# Patient Record
Sex: Male | Born: 1945 | Race: White | Hispanic: No | Smoking: Former smoker
Health system: Southern US, Community
[De-identification: ages and names within clinical notes are randomized; demographics above are authoritative.]

## PROBLEM LIST (undated history)

## (undated) DIAGNOSIS — I10 Essential (primary) hypertension: Secondary | ICD-10-CM

## (undated) HISTORY — PX: FEMUR FRACTURE SURGERY: SHX633

---

## 2017-04-29 ENCOUNTER — Other Ambulatory Visit: Payer: Self-pay

## 2017-04-29 ENCOUNTER — Emergency Department
Admission: EM | Admit: 2017-04-29 | Discharge: 2017-04-29 | Disposition: A | Discharge status: Home / Self Care | Payer: Medicare Other | Attending: Emergency Medicine | Admitting: Emergency Medicine

## 2017-04-29 ENCOUNTER — Encounter: Payer: Self-pay | Admitting: Emergency Medicine

## 2017-04-29 ENCOUNTER — Emergency Department: Payer: Medicare Other

## 2017-04-29 DIAGNOSIS — J40 Bronchitis, not specified as acute or chronic: Secondary | ICD-10-CM | POA: Insufficient documentation

## 2017-04-29 DIAGNOSIS — Z87891 Personal history of nicotine dependence: Secondary | ICD-10-CM | POA: Insufficient documentation

## 2017-04-29 DIAGNOSIS — R05 Cough: Secondary | ICD-10-CM

## 2017-04-29 DIAGNOSIS — I5020 Unspecified systolic (congestive) heart failure: Secondary | ICD-10-CM | POA: Diagnosis not present

## 2017-04-29 DIAGNOSIS — R059 Cough, unspecified: Secondary | ICD-10-CM

## 2017-04-29 LAB — BRAIN NATRIURETIC PEPTIDE: B Natriuretic Peptide: 1536 pg/mL — ABNORMAL HIGH (ref 0.0–100.0)

## 2017-04-29 LAB — BASIC METABOLIC PANEL
Anion gap: 9 (ref 5–15)
BUN: 15 mg/dL (ref 6–20)
CHLORIDE: 108 mmol/L (ref 101–111)
CO2: 23 mmol/L (ref 22–32)
CREATININE: 1.04 mg/dL (ref 0.61–1.24)
Calcium: 9.1 mg/dL (ref 8.9–10.3)
GFR calc Af Amer: >60 mL/min (ref >60)
GFR calc non Af Amer: >60 mL/min (ref >60)
GLUCOSE: 87 mg/dL (ref 65–99)
Potassium: 4.7 mmol/L (ref 3.5–5.1)
SODIUM: 140 mmol/L (ref 135–145)

## 2017-04-29 LAB — CBC
HCT: 40.7 % (ref 40.0–52.0)
Hemoglobin: 13.2 g/dL (ref 13.0–18.0)
MCH: 29.5 pg (ref 26.0–34.0)
MCHC: 32.4 g/dL (ref 32.0–36.0)
MCV: 90.8 fL (ref 80.0–100.0)
PLATELETS: 215 10*3/uL (ref 150–440)
RBC: 4.48 MIL/uL (ref 4.40–5.90)
RDW: 15 % — AB (ref 11.5–14.5)
WBC: 5.8 10*3/uL (ref 3.8–10.6)

## 2017-04-29 LAB — TROPONIN I: Troponin I: <0.03 ng/mL (ref <0.03)

## 2017-04-29 MED ORDER — IPRATROPIUM-ALBUTEROL 0.5-2.5 (3) MG/3ML IN SOLN
3.0000 mL | Freq: Once | RESPIRATORY_TRACT | Status: AC
  Administered 2017-04-29: 3 mL via RESPIRATORY_TRACT
  Filled 2017-04-29 (×2): qty 3

## 2017-04-29 MED ORDER — AZITHROMYCIN 250 MG PO TABS: ORAL_TABLET | ORAL | 0 refills | Status: AC

## 2017-04-29 MED ORDER — METHYLPREDNISOLONE SODIUM SUCC 125 MG IJ SOLR
125.0000 mg | Freq: Once | INTRAMUSCULAR | Status: AC
  Administered 2017-04-29: 125 mg via INTRAVENOUS
  Filled 2017-04-29: qty 2

## 2017-04-29 MED ORDER — IPRATROPIUM-ALBUTEROL 0.5-2.5 (3) MG/3ML IN SOLN
3.0000 mL | Freq: Once | RESPIRATORY_TRACT | Status: AC
  Administered 2017-04-29: 3 mL via RESPIRATORY_TRACT
  Filled 2017-04-29: qty 3

## 2017-04-29 MED ORDER — AZITHROMYCIN 500 MG PO TABS
500.0000 mg | ORAL_TABLET | Freq: Once | ORAL | Status: AC
  Administered 2017-04-29: 500 mg via ORAL
  Filled 2017-04-29: qty 1

## 2017-04-29 MED ORDER — ALBUTEROL SULFATE HFA 108 (90 BASE) MCG/ACT IN AERS: 2.0000 | INHALATION_SPRAY | Freq: Four times a day (QID) | RESPIRATORY_TRACT | 2 refills | Status: DC | PRN

## 2017-04-29 MED ORDER — FUROSEMIDE 20 MG PO TABS: ORAL_TABLET | ORAL | 11 refills | Status: DC

## 2017-04-29 MED ORDER — FUROSEMIDE 10 MG/ML IJ SOLN
20.0000 mg | Freq: Once | INTRAMUSCULAR | Status: AC
  Administered 2017-04-29: 20 mg via INTRAVENOUS
  Filled 2017-04-29: qty 4

## 2017-04-29 NOTE — ED Notes (Signed)
Ambulated pt with O2 starting at 95%. Pt states she feels ok at first and then starts to feel a little winded with O2 dropping to 88-89%. Pt back in bed and hooked to monitor.

## 2017-04-29 NOTE — ED Notes (Addendum)
Called pharmacy and spoke with Barbara Cower to send up duoneb since we are out in pyxis

## 2017-04-29 NOTE — ED Provider Notes (Signed)
[facility] Emergency Department Provider Note   ____________________________________________   First MD Initiated Contact with Patient 04/29/17 1612     (approximate)  I have reviewed the triage vital signs and the nursing notes.   HISTORY  Chief Complaint Cough    HPI Gerald Gross is a 72 y.o. male Patient has had a cough productive of some yellow-green phlegm for the last 2 weeks. Some chest pain when he coughs otherwise not. He does get winded if he walks a fair distance. Acute short of breath and he walks. In the emergency room patient received a DuoNeb and felt better on for a walk and desatted down to 88.   History reviewed. No pertinent past medical history.  There are no active problems to display for this patient.   History reviewed. No pertinent surgical history.  Prior to Admission medications   Not on File    Allergies Patient has no known allergies.  History reviewed. No pertinent family history.  Social History Social History   Tobacco Use  . Smoking status: Former Smoker    Types: Cigarettes    Last attempt to quit: 04/08/1990    Years since quitting: 27.0  . Smokeless tobacco: Never Used  Substance Use Topics  . Alcohol use: No    Frequency: Never  . Drug use: No    Review of Systems  Constitutional: No fever/chills Eyes: No visual changes. ENT: No sore throat. Cardiovascular: Denies chest pain. Respiratory: shortness of breath. Gastrointestinal: No abdominal pain.  No nausea, no vomiting.  No diarrhea.  No constipation. Genitourinary: Negative for dysuria. Musculoskeletal: Negative for back pain. Skin: Negative for rash. Neurological: Negative for headaches, focal weakness  ____________________________________________   PHYSICAL EXAM:  VITAL SIGNS: ED Triage Vitals  Enc Vitals Group     BP 04/29/17 1407 (!) 155/98     Pulse Rate 04/29/17 1407 96     Resp 04/29/17 1407 18     Temp 04/29/17 1407 98 F  (36.7 C)     Temp Source 04/29/17 1407 Oral     SpO2 04/29/17 1407 97 %     Weight 04/29/17 1408 210 lb (95.3 kg)     Height 04/29/17 1408 5\' 10"  (1.778 m)     Head Circumference --      Peak Flow --      Pain Score 04/29/17 1418 3     Pain Loc --      Pain Edu? --      Excl. in GC? --     Constitutional: Alert and oriented. Well appearing and in no acute distress. Eyes: Conjunctivae are normal.  Head: Atraumatic. Nose: No congestion/rhinnorhea. Mouth/Throat: Mucous membranes are moist.  Oropharynx non-erythematous. Neck: No stridor.   Cardiovascular: Normal rate, regular rhythm. Grossly normal heart sounds.  Good peripheral circulation. Respiratory: Normal respiratory effort.  No retractions. Lungs scattered wheezes Gastrointestinal: Soft and nontender. No distention. No abdominal bruits. No CVA tenderness. Musculoskeletal: No lower extremity tenderness nor edema.  No joint effusions. Neurologic:  Normal speech and language. No gross focal neurologic deficits are appreciated. No gait instability. Skin:  Skin is warm, dry and intact. No rash noted. Psychiatric: Mood and affect are normal. Speech and behavior are normal.  ____________________________________________   LABS (all labs ordered are listed, but only abnormal results are displayed)  Labs Reviewed  CBC - Abnormal; Notable for the following components:      Result Value   RDW 15.0 (*)    All  other components within normal limits  BRAIN NATRIURETIC PEPTIDE - Abnormal; Notable for the following components:   B Natriuretic Peptide 1,536.0 (*)    All other components within normal limits  BASIC METABOLIC PANEL  TROPONIN I   ____________________________________________  EKG  KG read and interpreted by me shows normal sinus rhythm rate of 100left axis flipped T's laterally no old EKGs are present there is one PVC ____________________________________________  RADIOLOGY  Dg Chest 2 View  Result Date:  04/29/2017 CLINICAL DATA:  Dyspnea for a week with chest pain for several days. Former smoker. Patient has had a cold for a few weeks. EXAM: CHEST  2 VIEW COMPARISON:  None. FINDINGS: The heart size and mediastinal contours are within normal limits. Diffuse coarsened interstitial lung markings are suspicious for chronic bronchitic change. No pneumonic consolidation. No effusion or pneumothorax. The visualized skeletal structures are unremarkable. IMPRESSION: Diffuse coarsened interstitial lung markings are suspicious for chronic bronchitic change. Electronically Signed   By: Tollie Eth M.D.   On: 04/29/2017 14:44  chest x-ray read as chronic bronchitis. Patient's coughing up green phlegm would be consistent with this however his BNP is also greater than 1000.  ____________________________________________   PROCEDURES  Procedure(s) performed:   Procedures  Critical Care performed:   ____________________________________________   INITIAL IMPRESSION / ASSESSMENT AND PLAN / ED COURSE       Clinical Course as of Apr 29 2157  Tue Apr 29, 2017  1612 HCT: 40.7 [PM]    Clinical Course User Index [PM] Arnaldo Natal, MD     ____________________________________________   FINAL CLINICAL IMPRESSION(S) / ED DIAGNOSES  after the Lasix patient reports he feels a lot better. I will give him Zithromax for a possible bronchitis and Lasix 20 to take every other day and an inhaler and I will have him follow-up with a doctor this week. will also give him 20 of Lasix to use every other day.     Final diagnoses:  Cough  Bronchitis  Systolic congestive heart failure, unspecified HF chronicity [facility])     ED Discharge Orders    None       Note:  This document was prepared using Dragon voice recognition software and may include unintentional dictation errors.    Arnaldo Natal, MD 04/29/17 2159

## 2017-04-29 NOTE — ED Notes (Signed)
Pt states he urinated about the couple times he went to toilet.

## 2017-04-29 NOTE — Discharge Instructions (Addendum)
use the inhaler 2 puffs 4 times a day as needed for cough or shortness of breath. Take the Zithromax as directed.take the Lasix one every other day. Please follow-up with a doctor this week. you can try [facility] walk-in or [facility] or [facility] or [facility] or the [facility] or [facility] clinic.please return here if he get worse at all.

## 2017-04-29 NOTE — ED Triage Notes (Signed)
Pt presents with cough x 3 weeks. Pt states he has been coughing up green phlegm x 2 weeks. Pt states chest only hurts when he is coughing. Denies respiratory distress or uri symptoms. Pt alert & oriented with NAD noted.

## 2017-05-14 ENCOUNTER — Other Ambulatory Visit (HOSPITAL_COMMUNITY): Payer: Self-pay | Admitting: Internal Medicine

## 2017-05-14 DIAGNOSIS — J849 Interstitial pulmonary disease, unspecified: Secondary | ICD-10-CM

## 2017-05-22 ENCOUNTER — Ambulatory Visit
Admission: RE | Admit: 2017-05-22 | Discharge: 2017-05-22 | Disposition: A | Discharge status: Home / Self Care | Payer: Medicare Other | Source: Ambulatory Visit | Attending: Internal Medicine | Admitting: Internal Medicine

## 2017-05-22 DIAGNOSIS — J849 Interstitial pulmonary disease, unspecified: Secondary | ICD-10-CM | POA: Diagnosis present

## 2017-05-22 DIAGNOSIS — J432 Centrilobular emphysema: Secondary | ICD-10-CM | POA: Insufficient documentation

## 2017-05-22 DIAGNOSIS — R918 Other nonspecific abnormal finding of lung field: Secondary | ICD-10-CM | POA: Insufficient documentation

## 2017-05-22 DIAGNOSIS — I700 Atherosclerosis of aorta: Secondary | ICD-10-CM | POA: Insufficient documentation

## 2017-05-22 DIAGNOSIS — J438 Other emphysema: Secondary | ICD-10-CM | POA: Insufficient documentation

## 2017-06-09 ENCOUNTER — Other Ambulatory Visit: Payer: Self-pay | Admitting: Cardiology

## 2017-06-10 ENCOUNTER — Encounter
Admission: RE | Disposition: A | Discharge status: Home / Self Care | Payer: Self-pay | Source: Ambulatory Visit | Attending: Cardiology

## 2017-06-10 ENCOUNTER — Ambulatory Visit
Admission: RE | Admit: 2017-06-10 | Discharge: 2017-06-10 | Disposition: A | Discharge status: Home / Self Care | Payer: Medicare Other | Source: Ambulatory Visit | Attending: Cardiology | Admitting: Cardiology

## 2017-06-10 DIAGNOSIS — E7849 Other hyperlipidemia: Secondary | ICD-10-CM | POA: Diagnosis not present

## 2017-06-10 DIAGNOSIS — I428 Other cardiomyopathies: Secondary | ICD-10-CM | POA: Insufficient documentation

## 2017-06-10 DIAGNOSIS — Z87891 Personal history of nicotine dependence: Secondary | ICD-10-CM | POA: Insufficient documentation

## 2017-06-10 DIAGNOSIS — Z8249 Family history of ischemic heart disease and other diseases of the circulatory system: Secondary | ICD-10-CM | POA: Diagnosis not present

## 2017-06-10 DIAGNOSIS — I429 Cardiomyopathy, unspecified: Secondary | ICD-10-CM

## 2017-06-10 DIAGNOSIS — I700 Atherosclerosis of aorta: Secondary | ICD-10-CM | POA: Diagnosis not present

## 2017-06-10 DIAGNOSIS — I5022 Chronic systolic (congestive) heart failure: Secondary | ICD-10-CM | POA: Diagnosis not present

## 2017-06-10 HISTORY — DX: Essential (primary) hypertension: I10

## 2017-06-10 HISTORY — PX: LEFT HEART CATH AND CORONARY ANGIOGRAPHY: CATH118249

## 2017-06-10 SURGERY — LEFT HEART CATH AND CORONARY ANGIOGRAPHY
Anesthesia: Moderate Sedation | Laterality: Left

## 2017-06-10 MED ORDER — ASPIRIN 81 MG PO CHEW
81.0000 mg | CHEWABLE_TABLET | ORAL | Status: AC
  Administered 2017-06-10: 81 mg via ORAL

## 2017-06-10 MED ORDER — IOPAMIDOL (ISOVUE-300) INJECTION 61%
INTRAVENOUS | Status: DC | PRN
  Administered 2017-06-10: 75 mL via INTRA_ARTERIAL

## 2017-06-10 MED ORDER — ASPIRIN 81 MG PO CHEW
CHEWABLE_TABLET | ORAL | Status: AC
  Filled 2017-06-10: qty 1

## 2017-06-10 MED ORDER — SODIUM CHLORIDE 0.9% FLUSH: 3.0000 mL | Freq: Two times a day (BID) | INTRAVENOUS | Status: DC

## 2017-06-10 MED ORDER — SODIUM CHLORIDE 0.9% FLUSH: 3.0000 mL | INTRAVENOUS | Status: DC | PRN

## 2017-06-10 MED ORDER — SODIUM CHLORIDE 0.9 % WEIGHT BASED INFUSION
3.0000 mL/kg/h | INTRAVENOUS | Status: AC
  Administered 2017-06-10: 3 mL/kg/h via INTRAVENOUS

## 2017-06-10 MED ORDER — FENTANYL CITRATE (PF) 100 MCG/2ML IJ SOLN
INTRAMUSCULAR | Status: AC
  Filled 2017-06-10: qty 2

## 2017-06-10 MED ORDER — SODIUM CHLORIDE 0.9 % IV SOLN: 250.0000 mL | INTRAVENOUS | Status: DC | PRN

## 2017-06-10 MED ORDER — MIDAZOLAM HCL 2 MG/2ML IJ SOLN
INTRAMUSCULAR | Status: AC
  Filled 2017-06-10: qty 2

## 2017-06-10 MED ORDER — SODIUM CHLORIDE 0.9 % WEIGHT BASED INFUSION: 1.0000 mL/kg/h | INTRAVENOUS | Status: DC

## 2017-06-10 MED ORDER — FENTANYL CITRATE (PF) 100 MCG/2ML IJ SOLN
INTRAMUSCULAR | Status: DC | PRN
  Administered 2017-06-10: 25 ug via INTRAVENOUS

## 2017-06-10 MED ORDER — MIDAZOLAM HCL 2 MG/2ML IJ SOLN
INTRAMUSCULAR | Status: DC | PRN
  Administered 2017-06-10: 1 mg via INTRAVENOUS

## 2017-06-10 MED ORDER — HEPARIN (PORCINE) IN NACL 2-0.9 UNIT/ML-% IJ SOLN
INTRAMUSCULAR | Status: AC
  Filled 2017-06-10: qty 1000

## 2017-06-10 SURGICAL SUPPLY — 9 items
CATH INFINITI 5FR ANG PIGTAIL (CATHETERS) ×2 IMPLANT
CATH INFINITI 5FR JL4 (CATHETERS) ×2 IMPLANT
CATH INFINITI JR4 5F (CATHETERS) ×2 IMPLANT
DEVICE CLOSURE MYNXGRIP 5F (Vascular Products) ×2 IMPLANT
KIT MANI 3VAL PERCEP (MISCELLANEOUS) ×2 IMPLANT
NEEDLE PERC 18GX7CM (NEEDLE) ×2 IMPLANT
PACK CARDIAC CATH (CUSTOM PROCEDURE TRAY) ×2 IMPLANT
SHEATH AVANTI 5FR X 11CM (SHEATH) ×2 IMPLANT
WIRE GUIDERIGHT .035X150 (WIRE) ×2 IMPLANT

## 2017-06-10 NOTE — H&P (Signed)
Chief Complaint: Chief Complaint  Patient presents with  . Establish Care  abnormal echo per dr Graciela Husbands  Date of Service: 06/02/2017 Date of Birth: October 04, 1945 PCP: Sallee Provencal, MD  History of Present Illness: Gerald Gross is a 72 y.o.male patient who presents in referral for evaluation of an abnormal echocardiogram. Patient has no prior cardiac history. He has begun noting increasing shortness of breath and fatigue. He underwent a CT of the chest which revealed normal heart size with no significant pericardial fluid or thickening. There is mild atherosclerotic non-aneurysmal changes in the thoracic aorta. No comment regarding the coronary arteries. Echocardiogram was read as showing an EF of 25% with global hypokinesis. Left atrium is mildly enlarged. No LV apical thrombus was seen. There is moderate MR mild AI mild TR. Patient denies any history in the recent past of chest pain. He has had mild viral illnesses but nothing significant. He denies syncope or presyncope. He does complain of shortness of breath with activity on occasion. He denies orthopnea or PND. He denies any sustained rapid or irregular heartbeat. He has no family history of heart disease. He does not smoke.  Past Medical and Surgical History  Past Medical History Past Medical History:  Diagnosis Date  . Aortic atherosclerosis (CMS-HCC) 05/25/2017  By CT 2/19  . Chronic systolic CHF (congestive heart failure) (CMS-HCC) 05/21/2017  Echo 2/19 reveals LVEF 25%. Cardiology consultation obtained  . Interstitial lung disorders , unspecified (CMS-HCC) 05/25/2017  By CT 2/19; right lung apex opacity also noted. Pulmonary consultation arranged  . Other hyperlipidemia 05/21/2017   Past Surgical History He has a past surgical history that includes Fracture surgery (Right).   Medications and Allergies  Current Medications  Current Outpatient Medications  Medication Sig Dispense Refill  . folic acid/multivit-min/lutein (CENTRUM  SILVER ORAL) Take by mouth once daily  . carvedilol (COREG) 3.125 MG tablet Take 1 tablet (3.125 mg total) by mouth 2 (two) times daily with meals 60 tablet 11  . FUROsemide (LASIX) 20 MG tablet take one by mouth daily   No current facility-administered medications for this visit.   Allergies: Patient has no known allergies.  Social and Family History  Social History reports that he quit smoking about 27 years ago. He has never used smokeless tobacco. He reports that he does not drink alcohol or use drugs.  Family History Family History  Problem Relation Age of Onset  . Diabetes type II Brother  . Coronary Artery Disease (Blocked arteries around heart) Brother   Review of Systems  Review of Systems  Constitutional: Negative for chills, diaphoresis, fever, malaise/fatigue and weight loss.  HENT: Negative for congestion, ear discharge, hearing loss and tinnitus.  Eyes: Negative for blurred vision.  Respiratory: Positive for shortness of breath. Negative for cough, hemoptysis, sputum production and wheezing.  Cardiovascular: Negative for chest pain, palpitations, orthopnea, claudication, leg swelling and PND.  Gastrointestinal: Negative for abdominal pain, blood in stool, constipation, diarrhea, heartburn, melena, nausea and vomiting.  Genitourinary: Negative for dysuria, frequency, hematuria and urgency.  Musculoskeletal: Negative for back pain, falls, joint pain and myalgias.  Skin: Negative for itching and rash.  Neurological: Negative for dizziness, tingling, focal weakness, loss of consciousness, weakness and headaches.  Endo/Heme/Allergies: Negative for polydipsia. Does not bruise/bleed easily.  Psychiatric/Behavioral: Negative for depression, memory loss and substance abuse. The patient is not nervous/anxious.   Physical Examination   Vitals:BP 120/74  Pulse 84  Resp 12  Ht 175.3 cm (5\' 9" )  Wt 96.6  kg (213 lb)  BMI 31.45 kg/m  Ht:175.3 cm (5\' 9" ) Wt:96.6 kg (213 lb)  AVW:UJWJ surface area is 2.17 meters squared. Body mass index is 31.45 kg/m.  Wt Readings from Last 3 Encounters:  06/02/17 96.6 kg (213 lb)  05/14/17 90.7 kg (200 lb)  04/29/17 95.4 kg (210 lb 6.4 oz)   BP Readings from Last 3 Encounters:  06/02/17 120/74  05/14/17 145/80  04/29/17 (!) 163/95   General appearance appears in no acute distress  Head Mouth and Eye exam Normocephalic, without obvious abnormality, atraumatic Dentition is good Eyes appear anicteric   Neck exam Thyroid: normal  Nodes: no obvious adenopathy  LUNGS Breath Sounds: Normal Percussion: Normal  CARDIOVASCULAR JVP CV wave: no HJR: no Elevation at 90 degrees: None Carotid Pulse: normal pulsation bilaterally Bruit: None Apex: apical impulse normal  Auscultation Rhythm: normal sinus rhythm S1: normal S2: normal Clicks: no Rub: no Murmurs: 1/6 medium pitched mid systolic blowing at lower left sternal border  Gallop: None ABDOMEN Liver enlargement: no Pulsatile aorta: no Ascites: no Bruits: no  EXTREMITIES Clubbing: no Edema: 1+ bilateral pedal edema Pulses: peripheral pulses symmetrical Femoral Bruits: no Amputation: no SKIN Rash: no Cyanosis: no Embolic phemonenon: no Bruising: no NEURO Alert and Oriented to person, place and time: yes Non focal: yes  PSYCH: Pt appears to have normal affect  LABS REVIEWED Last 3 CBC results: No results found for: WBC No results found for: HGB No results found for: HCT  No results found for: PLT  No results found for: CREATININE, BUN, NA, K, CL, CO2  No results found for: HGBA1C  Lab Results  Component Value Date  HDL 55.5 05/14/2017   Lab Results  Component Value Date  LDLCALC 114 05/14/2017   Lab Results  Component Value Date  TRIG 88 05/14/2017   No results found for: ALT, AST, GGT, ALKPHOS, BILITOT  Lab Results  Component Value Date  TSH 1.668 05/14/2017   Diagnostic Studies Reviewed:  EKG EKG demonstrated  normal sinus rhythm.  Assessment and Plan   72 y.o. male with  ICD-10-CM ICD-9-CM  1. Chronic systolic CHF (congestive heart failure) (CMS-HCC)-etiology of cardiomyopathy unclear. Does not drink alcohol. No clear-cut ischemic episodes. May be idiopathic versus viral. Given severity of cardiomyopathy will need to evaluate for coronary etiology to guide further therapy. We will proceed with left heart cath to evaluate coronary anatomy. We will also need to begin evidence-based therapy to include evidence-based beta-blockers as tolerated, ACE inhibitor or ARB as well as spironolactone or eplerenone. After 3 months of evidence-based therapy, repeat echocardiogram will be indicated to evaluate for any improvement in the LV function. If his EF remains less than 35%, consideration for AICD for primary prevention will need to be discussed. Low-sodium diet is recommended. I50.22 428.22  428.0  2. Aortic atherosclerosis (CMS-HCC) I70.0 440.0  3. Other hyperlipidemia E78.49 272.4  4. Interstitial lung disorders , unspecified (CMS-HCC)-continue to follow. J84.9 515   Return in about 3 weeks (around 06/23/2017).  These notes generated with voice recognition software. I apologize for typographical errors.  Gerald Ar, MD       Pt seen and examined. No change from above.

## 2017-06-10 NOTE — Progress Notes (Signed)
Patient clinically stable post heart cath per Dr Lady Gary, sister at bedside. Dr Lady Gary out to speak with patient and sister with questions answered regarding procedure. No bleeding nor hematoma at right groin site. Denies complaints. Discharge instructions given with questions answered. Return appointment given.

## 2017-06-10 NOTE — Discharge Instructions (Signed)
Groin Insertion Instructions-If you lose feeling or develop tingling or pain in your leg or foot after the procedure, please walk around first.  If the discomfort does not improve , contact your physician and proceed to the nearest emergency room.  Loss of feeling in your leg might mean that a blockage has formed in the artery and this can be appropriately treated.  Limit your activity for the next two days after your procedure.  Avoid stooping, bending, heavy lifting or exertion as this may put pressure on the insertion site.  Resume normal activities in 48 hours.  You may shower after 24 hours but avoid excessive warm water and do not scrub the site.  Remove clear dressing in 48 hours.  If you have had a closure device inserted, do not soak in a tub bath or a hot tub for at least one week. ° °No driving for 48 hours after discharge.  After the procedure, check the insertion site occasionally.  If any oozing occurs or there is apparent swelling, firm pressure over the site will prevent a bruise from forming.  You can not hurt anything by pressing directly on the site.  The pressure stops the bleeding by allowing a small clot to form.  If the bleeding continues after the pressure has been applied for more than 15 minutes, call 911 or go to the nearest emergency room.   ° °The x-ray dye causes you to pass a considerate amount of urine.  For this reason, you will be asked to drink plenty of liquids after the procedure to prevent dehydration.  You may resume you regular diet.  Avoid caffeine products.   ° °For pain at the site of your procedure, take non-aspirin medicines such as Tylenol. ° °Medications: A. Hold Metformin for 48 hours if applicable.  B. Continue taking all your present medications at home unless your doctor prescribes any changes. ° °Moderate Conscious Sedation, Adult, Care After °These instructions provide you with information about caring for yourself after your procedure. Your health care provider  may also give you more specific instructions. Your treatment has been planned according to current medical practices, but problems sometimes occur. Call your health care provider if you have any problems or questions after your procedure. °What can I expect after the procedure? °After your procedure, it is common: °· To feel sleepy for several hours. °· To feel clumsy and have poor balance for several hours. °· To have poor judgment for several hours. °· To vomit if you eat too soon. ° °Follow these instructions at home: °For at least 24 hours after the procedure: ° °· Do not: °? Participate in activities where you could fall or become injured. °? Drive. °? Use heavy machinery. °? Drink alcohol. °? Take sleeping pills or medicines that cause drowsiness. °? Make important decisions or sign legal documents. °? Take care of children on your own. °· Rest. °Eating and drinking °· Follow the diet recommended by your health care provider. °· If you vomit: °? Drink water, juice, or soup when you can drink without vomiting. °? Make sure you have little or no nausea before eating solid foods. °General instructions °· Have a responsible adult stay with you until you are awake and alert. °· Take over-the-counter and prescription medicines only as told by your health care provider. °· If you smoke, do not smoke without supervision. °· Keep all follow-up visits as told by your health care provider. This is important. °Contact a health care provider   if: °· You keep feeling nauseous or you keep vomiting. °· You feel light-headed. °· You develop a rash. °· You have a fever. °Get help right away if: °· You have trouble breathing. °This information is not intended to replace advice given to you by your health care provider. Make sure you discuss any questions you have with your health care provider. °Document Released: 01/13/2013 Document Revised: 08/28/2015 Document Reviewed: 07/15/2015 °Elsevier Interactive Patient Education © 2018  [employer] ° °

## 2017-06-13 ENCOUNTER — Other Ambulatory Visit: Payer: Self-pay | Admitting: Specialist

## 2017-06-13 DIAGNOSIS — R0602 Shortness of breath: Secondary | ICD-10-CM

## 2017-06-18 ENCOUNTER — Ambulatory Visit: Payer: Medicare Other | Attending: Specialist

## 2019-08-23 IMAGING — CT CT CHEST HIGH RESOLUTION W/O CM
2 of 7 series · 12 of 36 positions shown, 15 images · non-contrast
Comparison: 04/29/2017 chest radiograph.

CLINICAL DATA: Dyspnea with mild exertion. Remote smoking history.
Evaluate for interstitial lung disease.

EXAM:
CT CHEST WITHOUT CONTRAST
TECHNIQUE: Multidetector CT imaging of the chest was performed following the
standard protocol without intravenous contrast. High resolution
imaging of the lungs, as well as inspiratory and expiratory imaging,
was performed.

[Series 2: thorax 2.00 br36 s3 ax · axial · 0.60mm/px · z∈[-1354,-1082]mm · 9 of 168 slices shown, 12 images]
[im 16/168  mediastinal]
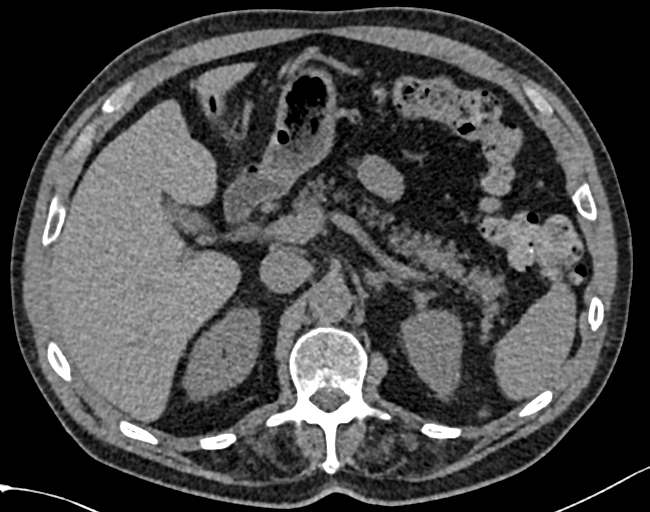
[im 16/168  lung]
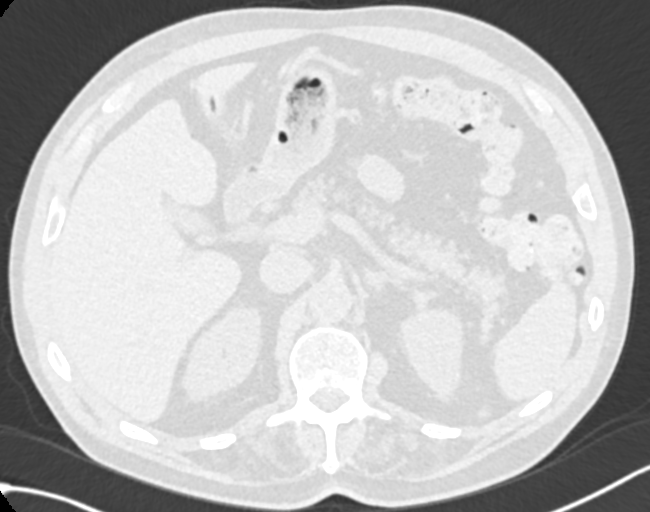
[im 31/168  lung]
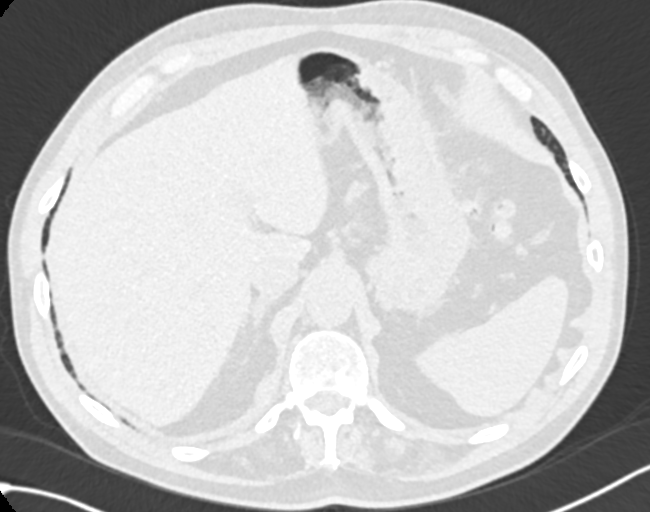
[im 46/168  lung]
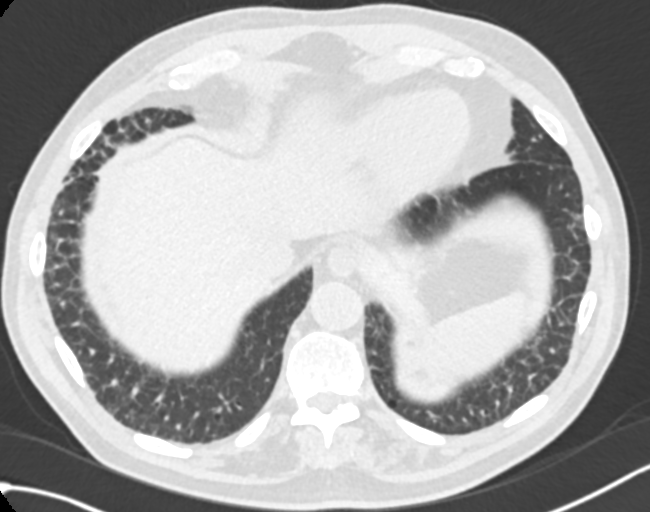
[im 61/168  lung]
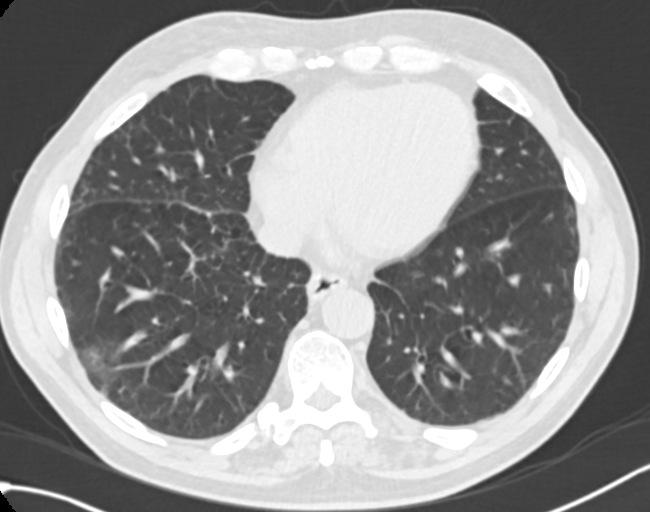
[im 92/168  mediastinal]
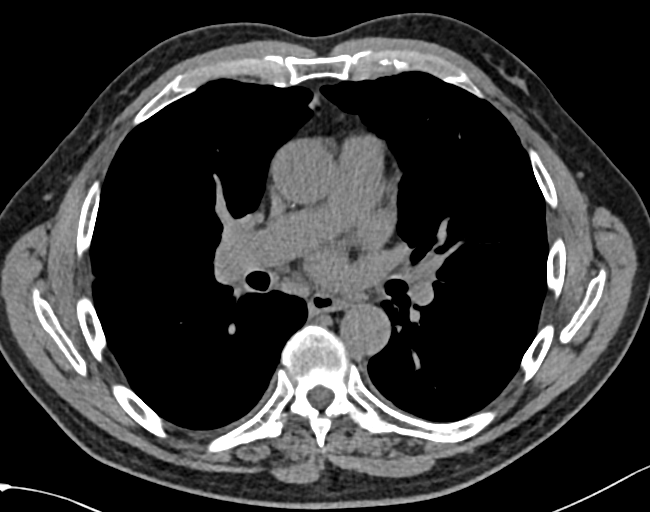
[im 92/168  lung]
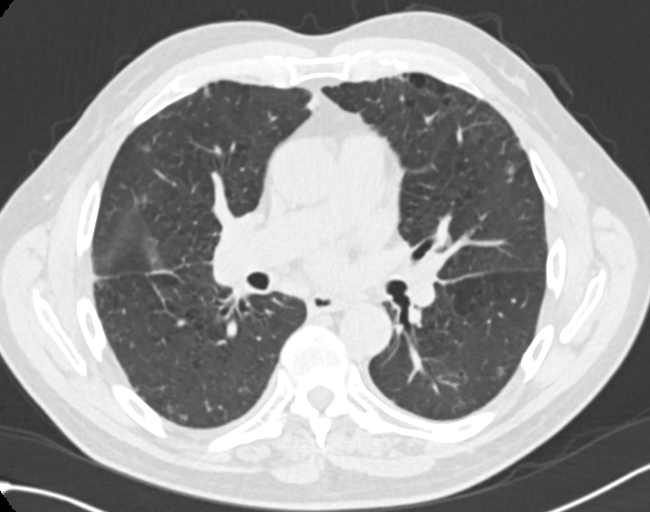
[im 107/168  lung]
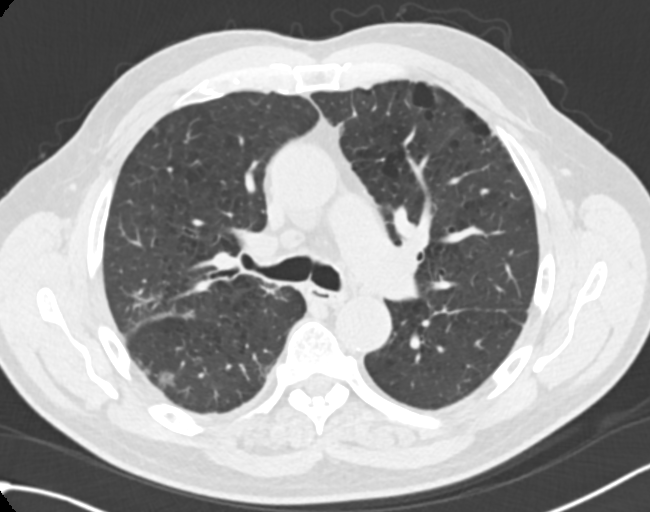
[im 122/168  lung]
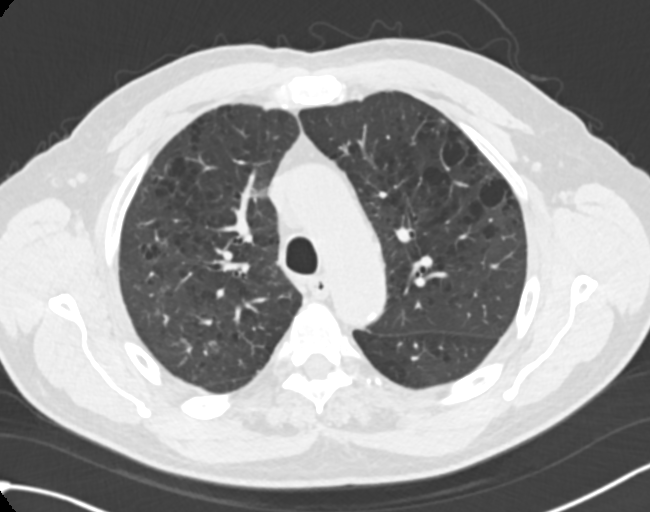
[im 137/168  lung]
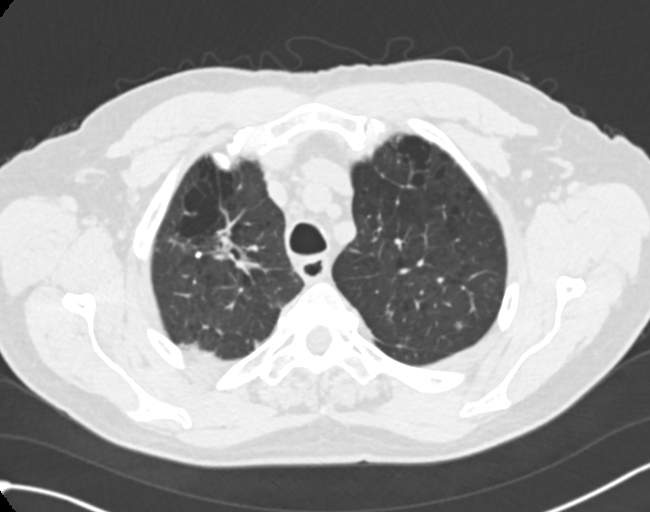
[im 152/168  mediastinal]
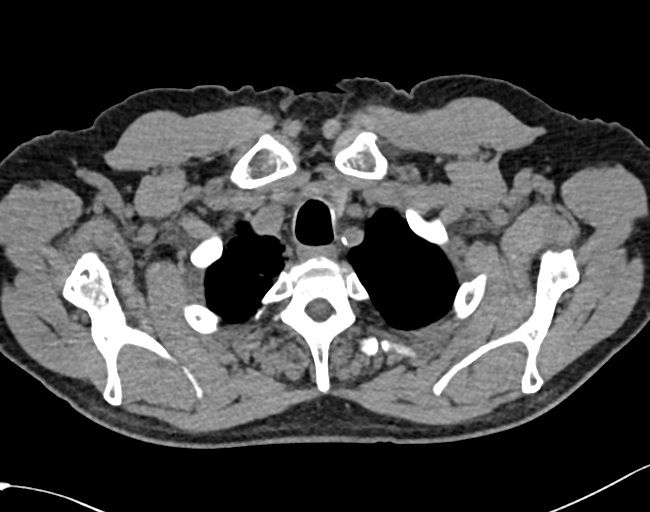
[im 152/168  lung]
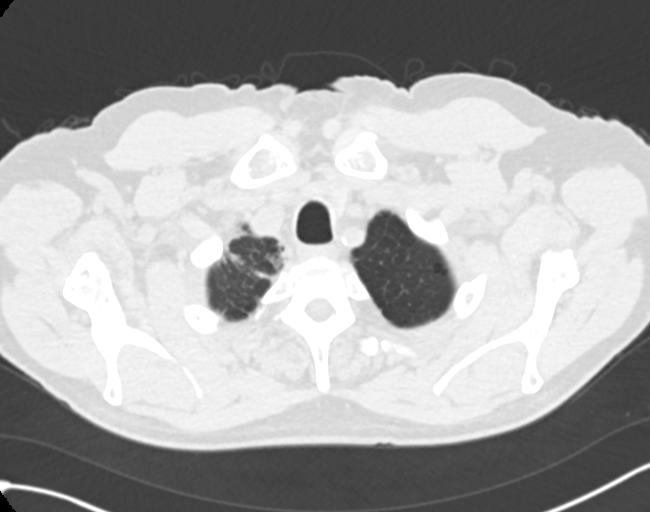

[Series 4: thorax 2.00 br36 s3 cor · coronal · 0.66mm/px · 3 of 153 slices shown]
[im 31/153  lung]
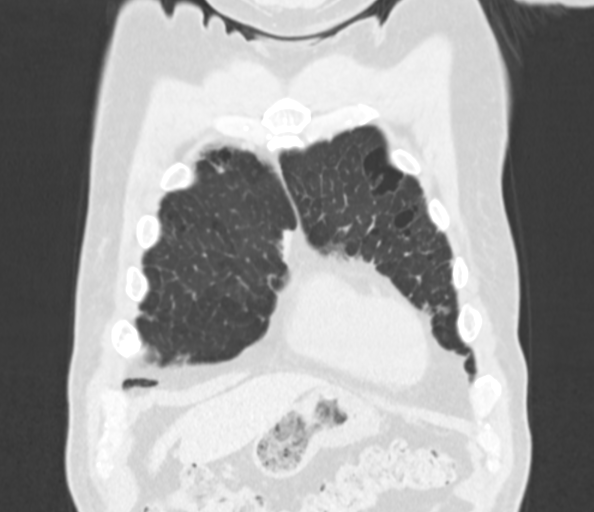
[im 61/153  lung]
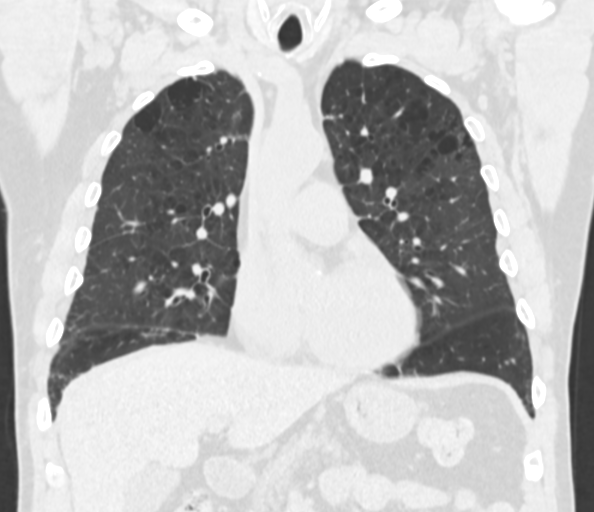
[im 92/153  lung]
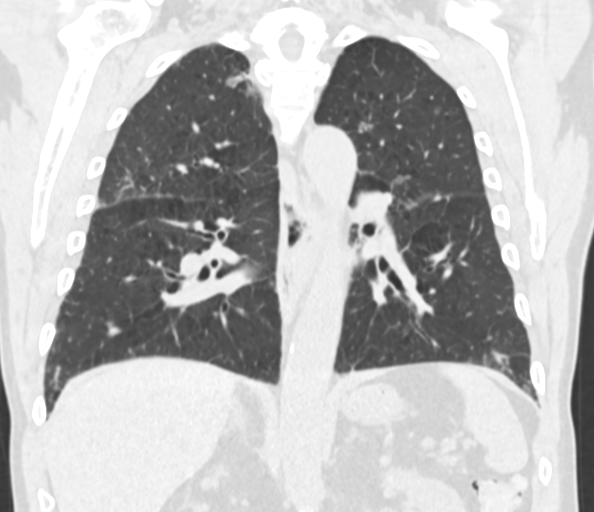

[12 of 36 positions shown; findings below may reference images not displayed]

FINDINGS: Cardiovascular: Normal heart size. No significant pericardial
fluid/thickening. Mildly atherosclerotic nonaneurysmal thoracic
aorta. Top-normal caliber main pulmonary artery (3.1 cm diameter).

Mediastinum/Nodes: No discrete thyroid nodules. Unremarkable
esophagus. No pathologically enlarged axillary, mediastinal or gross
hilar lymph nodes, noting limited sensitivity for the detection of
hilar adenopathy on this noncontrast study.

Lungs/Pleura: No pneumothorax. No pleural effusion. No acute
consolidative airspace disease, lung masses or significant pulmonary
nodules. There is patchy subpleural reticulation and peripheral
ground-glass attenuation throughout both lungs, without a clear
basilar gradient. No significant regions of traction bronchiectasis
or frank honeycombing. Irregular bandlike opacity at the right lung
apex (series 8/image 27). Moderate centrilobular and mild paraseptal
emphysema. No significant lobular air trapping on the expiration
sequence.

Upper abdomen: Tiny granulomatous calcification in the right liver
lobe.

Musculoskeletal: No aggressive appearing focal osseous lesions. Mild
thoracic spondylosis.
IMPRESSION: 1. Patchy subpleural reticulation and ground-glass attenuation in
both lungs without a clear basilar gradient and without significant
bronchiectasis or frank honeycombing. Findings may represent an
interstitial lung disease such as nonspecific interstitial pneumonia
(NSIP). Early usual interstitial pneumonia (UIP) is considered less
likely but is not entirely excluded. Consider follow-up
high-resolution chest CT in 12 months to assess temporal pattern
stability, as clinically warranted.
2. Irregular bandlike opacity at the right lung apex, nonspecific,
favor postinfectious scarring. Recommend attention on follow-up
chest CT in 3-6 months to document stability of this finding.

Aortic Atherosclerosis (LB9KD-LDB.B) and Emphysema (LB9KD-TCG.J).

## 2020-11-17 NOTE — Discharge Instructions (Signed)

## 2020-11-20 ENCOUNTER — Encounter: Payer: Self-pay | Admitting: Ophthalmology

## 2020-11-21 ENCOUNTER — Ambulatory Visit: Payer: Medicare Other | Admitting: Anesthesiology

## 2020-11-21 ENCOUNTER — Encounter: Payer: Self-pay | Admitting: Ophthalmology

## 2020-11-21 ENCOUNTER — Encounter
Admission: RE | Disposition: A | Discharge status: Home / Self Care | Payer: Self-pay | Source: Home / Self Care | Attending: Ophthalmology

## 2020-11-21 ENCOUNTER — Ambulatory Visit
Admission: RE | Admit: 2020-11-21 | Discharge: 2020-11-21 | Disposition: A | Discharge status: Home / Self Care | Payer: Medicare Other | Attending: Ophthalmology | Admitting: Ophthalmology

## 2020-11-21 ENCOUNTER — Other Ambulatory Visit: Payer: Self-pay

## 2020-11-21 DIAGNOSIS — Z87891 Personal history of nicotine dependence: Secondary | ICD-10-CM | POA: Diagnosis not present

## 2020-11-21 DIAGNOSIS — H2511 Age-related nuclear cataract, right eye: Secondary | ICD-10-CM | POA: Insufficient documentation

## 2020-11-21 HISTORY — PX: CATARACT EXTRACTION W/PHACO: SHX586

## 2020-11-21 SURGERY — PHACOEMULSIFICATION, CATARACT, WITH IOL INSERTION
Anesthesia: Monitor Anesthesia Care | Site: Eye | Laterality: Right

## 2020-11-21 MED ORDER — LACTATED RINGERS IV SOLN: INTRAVENOUS | Status: DC

## 2020-11-21 MED ORDER — OXYCODONE HCL 5 MG PO TABS: 5.0000 mg | ORAL_TABLET | Freq: Once | ORAL | Status: DC | PRN

## 2020-11-21 MED ORDER — SIGHTPATH DOSE#1 SODIUM HYALURONATE 23 MG/ML IO SOLUTION
PREFILLED_SYRINGE | INTRAOCULAR | Status: DC | PRN
  Administered 2020-11-21: .6 mL via INTRAOCULAR

## 2020-11-21 MED ORDER — FENTANYL CITRATE (PF) 100 MCG/2ML IJ SOLN
INTRAMUSCULAR | Status: DC | PRN
  Administered 2020-11-21: 50 ug via INTRAVENOUS

## 2020-11-21 MED ORDER — LABETALOL HCL 5 MG/ML IV SOLN
INTRAVENOUS | Status: DC | PRN
  Administered 2020-11-21: 5 mg via INTRAVENOUS

## 2020-11-21 MED ORDER — SIGHTPATH DOSE#1 BSS IO SOLN
INTRAOCULAR | Status: DC | PRN
  Administered 2020-11-21: 76 mL via OPHTHALMIC

## 2020-11-21 MED ORDER — CYCLOPENTOLATE HCL 2 % OP SOLN
1.0000 [drp] | OPHTHALMIC | Status: DC | PRN
  Administered 2020-11-21 (×3): 1 [drp] via OPHTHALMIC

## 2020-11-21 MED ORDER — SIGHTPATH DOSE#1 SODIUM HYALURONATE 10 MG/ML IO SOLUTION
PREFILLED_SYRINGE | INTRAOCULAR | Status: DC | PRN
  Administered 2020-11-21: 0.55 mL via INTRAOCULAR

## 2020-11-21 MED ORDER — PHENYLEPHRINE HCL 10 % OP SOLN
1.0000 [drp] | OPHTHALMIC | Status: DC | PRN
  Administered 2020-11-21 (×3): 1 [drp] via OPHTHALMIC

## 2020-11-21 MED ORDER — LIDOCAINE HCL (PF) 2 % IJ SOLN
INTRAOCULAR | Status: DC | PRN
  Administered 2020-11-21: 1 mL via INTRAOCULAR

## 2020-11-21 MED ORDER — MIDAZOLAM HCL 2 MG/2ML IJ SOLN
INTRAMUSCULAR | Status: DC | PRN
  Administered 2020-11-21: 1 mg via INTRAVENOUS
  Administered 2020-11-21: .5 mg via INTRAVENOUS

## 2020-11-21 MED ORDER — OXYCODONE HCL 5 MG/5ML PO SOLN: 5.0000 mg | Freq: Once | ORAL | Status: DC | PRN

## 2020-11-21 MED ORDER — MOXIFLOXACIN HCL 0.5 % OP SOLN
OPHTHALMIC | Status: DC | PRN
  Administered 2020-11-21: 0.2 mL via OPHTHALMIC

## 2020-11-21 MED ORDER — SIGHTPATH DOSE#1 BSS IO SOLN
INTRAOCULAR | Status: DC | PRN
  Administered 2020-11-21: 15 mL

## 2020-11-21 MED ORDER — TETRACAINE HCL 0.5 % OP SOLN
1.0000 [drp] | OPHTHALMIC | Status: DC | PRN
  Administered 2020-11-21 (×3): 1 [drp] via OPHTHALMIC

## 2020-11-21 MED ORDER — LABETALOL HCL 5 MG/ML IV SOLN
5.0000 mg | INTRAVENOUS | Status: DC | PRN
  Administered 2020-11-21 (×2): 5 mg via INTRAVENOUS

## 2020-11-21 SURGICAL SUPPLY — 16 items
CANNULA ANT/CHMB 27GA (MISCELLANEOUS) ×4 IMPLANT
DISSECTOR HYDRO NUCLEUS 50X22 (MISCELLANEOUS) ×2 IMPLANT
GLOVE SURG ENC TEXT LTX SZ7.5 (GLOVE) ×2 IMPLANT
GLOVE SURG SYN 8.5  E (GLOVE) ×1
GLOVE SURG SYN 8.5 E (GLOVE) ×1 IMPLANT
GOWN STRL REUS W/ TWL LRG LVL3 (GOWN DISPOSABLE) ×2 IMPLANT
GOWN STRL REUS W/TWL LRG LVL3 (GOWN DISPOSABLE) ×4
LENS IOL EYHANCE TORIC II 21.0 ×2 IMPLANT
LENS IOL EYHANCE TRC 600 21.0 ×1 IMPLANT
LENS IOL EYHNC TORIC 600 21.0 ×1 IMPLANT
MARKER SKIN DUAL TIP RULER LAB (MISCELLANEOUS) ×2 IMPLANT
PACK EYE AFTER SURG (MISCELLANEOUS) ×2 IMPLANT
SYR 3ML LL SCALE MARK (SYRINGE) ×2 IMPLANT
SYR TB 1ML LUER SLIP (SYRINGE) ×2 IMPLANT
WATER STERILE IRR 250ML POUR (IV SOLUTION) ×2 IMPLANT
WIPE NON LINTING 3.25X3.25 (MISCELLANEOUS) ×2 IMPLANT

## 2020-11-21 NOTE — H&P (Signed)
[facility]   Primary Care Physician:  Lynnea Ferrier, MD Ophthalmologist: Dr. Willey Blade  Pre-Procedure History & Physical: HPI:  Gerald Gross is a 75 y.o. male here for cataract surgery.   Past Medical History:  Diagnosis Date   Hypertension     Past Surgical History:  Procedure Laterality Date   FEMUR FRACTURE SURGERY Right    LEFT HEART CATH AND CORONARY ANGIOGRAPHY Left 06/10/2017   Procedure: LEFT HEART CATH AND CORONARY ANGIOGRAPHY;  Surgeon: Dalia Heading, MD;  Location: [facility] INVASIVE CV LAB;  Service: Cardiovascular;  Laterality: Left;    Prior to Admission medications   Medication Sig Start Date End Date Taking? Authorizing Provider  carvedilol (COREG) 3.125 MG tablet Take 3.125 mg by mouth 2 (two) times daily with a meal. Patient not taking: No sig reported    [provider]  lisinopril (ZESTRIL) 20 MG tablet Take 20 mg by mouth daily. Patient not taking: Reported on 11/20/2020    [provider]  Multiple Vitamin (MULTIVITAMIN WITH MINERALS) TABS tablet Take 1 tablet by mouth daily. Centrum Silver Patient not taking: Reported on 11/20/2020    [provider]    Allergies as of 10/11/2020   (No Known Allergies)    History reviewed. No pertinent family history.  Social History   Socioeconomic History   Marital status: Widowed    Spouse name: Not on file   Number of children: Not on file   Years of education: Not on file   Highest education level: Not on file  Occupational History   Not on file  Tobacco Use   Smoking status: Former    Types: Cigarettes    Quit date: 04/08/1990    Years since quitting: 30.6   Smokeless tobacco: Never  Vaping Use   Vaping Use: Never used  Substance and Sexual Activity   Alcohol use: No   Drug use: No   Sexual activity: Not Currently  Other Topics Concern   Not on file  Social History Narrative   Not on file   Social Determinants of Health   Financial Resource Strain: Not  on file  Food Insecurity: Not on file  Transportation Needs: Not on file  Physical Activity: Not on file  Stress: Not on file  Social Connections: Not on file  Intimate Partner Violence: Not on file    Review of Systems: See HPI, otherwise negative ROS  Physical Exam: BP (!) 152/99   Pulse 74   Temp (!) 97.2 F (36.2 C) (Temporal)   Ht 5\' 10"  (1.778 m)   Wt 97.7 kg   SpO2 97%   BMI 30.91 kg/m  General:   Alert, cooperative in NAD Head:  Normocephalic and atraumatic. Respiratory:  Normal work of breathing. Cardiovascular:  RRR  Impression/Plan: Gerald Gross is here for cataract surgery.  Risks, benefits, limitations, and alternatives regarding cataract surgery have been reviewed with the patient.  Questions have been answered.  All parties agreeable.   Clydene Laming, MD  11/21/2020, 9:04 AM

## 2020-11-21 NOTE — Transfer of Care (Signed)
Immediate Anesthesia Transfer of Care Note  Patient: Gerald Gross  Procedure(s) Performed: CATARACT EXTRACTION PHACO AND INTRAOCULAR LENS PLACEMENT (IOC) RIGHT EYHANCE TORIC 3.50 00:25.8 (Right: Eye)  Patient Location: PACU  Anesthesia Type: MAC  Level of Consciousness: awake, alert  and patient cooperative  Airway and Oxygen Therapy: Patient Spontanous Breathing and Patient connected to supplemental oxygen  Post-op Assessment: Post-op Vital signs reviewed, Patient's Cardiovascular Status Stable, Respiratory Function Stable, Patent Airway and No signs of Nausea or vomiting  Post-op Vital Signs: Reviewed and stable  Complications: No notable events documented.

## 2020-11-21 NOTE — Addendum Note (Signed)
Addendum  created 11/21/20 0955 by Orrin Brigham, MD   Order list changed, Order sets accessed, Pharmacy for encounter modified, Review and Sign - Ready for Procedure, Review and Sign - Signed

## 2020-11-21 NOTE — Anesthesia Postprocedure Evaluation (Signed)
Anesthesia Post Note  Patient: Gerald Gross  Procedure(s) Performed: CATARACT EXTRACTION PHACO AND INTRAOCULAR LENS PLACEMENT (IOC) RIGHT EYHANCE TORIC 3.50 00:25.8 (Right: Eye)     Patient location during evaluation: PACU Anesthesia Type: MAC Level of consciousness: awake and alert Pain management: pain level controlled Vital Signs Assessment: post-procedure vital signs reviewed and stable Respiratory status: spontaneous breathing, nonlabored ventilation, respiratory function stable and patient connected to nasal cannula oxygen Cardiovascular status: stable and blood pressure returned to baseline Postop Assessment: no apparent nausea or vomiting Anesthetic complications: no   No notable events documented.  Shirah Roseman    BP in PACU was 140s/90s.  Pt advised to resumed home HTN meds.

## 2020-11-21 NOTE — Op Note (Signed)
OPERATIVE NOTE  Gerald Gross [id number] 11/21/2020   PREOPERATIVE DIAGNOSIS:  Nuclear sclerotic cataract right eye.  H25.11   POSTOPERATIVE DIAGNOSIS:    Nuclear sclerotic cataract right eye.     PROCEDURE:  Phacoemusification with posterior chamber intraocular lens placement of the right eye   LENS:   Implant Name Type Inv. Item Serial No. Manufacturer Lot No. LRB No. Used Action  Tecnis Eyhance Toric II IOL Intraocular Lens  [id number] [employer]  Right 1 Implanted       Procedure(s): CATARACT EXTRACTION PHACO AND INTRAOCULAR LENS PLACEMENT (IOC) RIGHT EYHANCE TORIC 3.50 00:25.8 (Right)  [id number] +21.0   ULTRASOUND TIME: 0 minutes 25 seconds.  CDE 3.50   SURGEON:  Willey Blade, MD, MPH  ANESTHESIOLOGIST: Anesthesiologist: Orrin Brigham, MD CRNA: Lily Kocher, CRNA   ANESTHESIA:  Topical with tetracaine drops augmented with 1% preservative-free intracameral lidocaine.  ESTIMATED BLOOD LOSS: less than 1 mL.   COMPLICATIONS:  None.   DESCRIPTION OF PROCEDURE:  The patient was identified in the holding room and transported to the operating room and placed in the supine position under the operating microscope.  The right eye was identified as the operative eye and it was prepped and draped in the usual sterile ophthalmic fashion.  The verion system was registered without difficulty.   A 1.0 millimeter clear-corneal paracentesis was made at the 10:30 position. 0.5 ml of preservative-free 1% lidocaine with epinephrine was injected into the anterior chamber.  The anterior chamber was filled with Healon 5 viscoelastic.  A 2.4 millimeter keratome was used to make a near-clear corneal incision at the 8:00 position.  A curvilinear capsulorrhexis was made with a cystotome and capsulorrhexis forceps.  Balanced salt solution was used to hydrodissect and hydrodelineate the nucleus.   Phacoemulsification was then used in stop and chop fashion to remove the lens nucleus and  epinucleus.  The remaining cortex was then removed using the irrigation and aspiration handpiece. Healon was then placed into the capsular bag to distend it for lens placement.  A lens was then injected into the capsular bag.  The remaining viscoelastic was aspirated.  The lens was rotated with guidance from the verion system.   Wounds were hydrated with balanced salt solution.  The anterior chamber was inflated to a physiologic pressure with balanced salt solution. The lens was well centered.  Intracameral vigamox 0.1 mL undiluted was injected into the eye and a drop placed onto the ocular surface.  No wound leaks were noted.  The patient was taken to the recovery room in stable condition without complications of anesthesia or surgery  Willey Blade 11/21/2020, 9:40 AM

## 2020-11-21 NOTE — Anesthesia Preprocedure Evaluation (Signed)
Anesthesia Evaluation  Patient identified by MRN, date of birth, ID band Patient awake    Reviewed: NPO status   History of Anesthesia Complications Negative for: history of anesthetic complications  Airway Mallampati: II  TM Distance: >3 FB Neck ROM: full    Dental no notable dental hx.    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   Echocardiogram on 09/15/17 revealed moderate LV systolic dysfunction with an EF estimated at 30% with moderate AR and mild MR.     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       Plan Discussed with: CRNA  Anesthesia Plan Comments:         Anesthesia Quick Evaluation

## 2020-11-21 NOTE — Anesthesia Procedure Notes (Signed)
Procedure Name: MAC Date/Time: 11/21/2020 9:15 AM Performed by: Dionne Bucy, CRNA Pre-anesthesia Checklist: Patient identified, Emergency Drugs available, Suction available, Patient being monitored and Timeout performed Patient Re-evaluated:Patient Re-evaluated prior to induction Oxygen Delivery Method: Nasal cannula Placement Confirmation: positive ETCO2

## 2020-11-22 ENCOUNTER — Encounter: Payer: Self-pay | Admitting: Ophthalmology

## 2020-11-23 ENCOUNTER — Encounter: Payer: Self-pay | Admitting: Ophthalmology

## 2020-11-30 NOTE — Discharge Instructions (Signed)

## 2020-12-04 ENCOUNTER — Encounter: Payer: Self-pay | Admitting: Ophthalmology

## 2020-12-04 ENCOUNTER — Ambulatory Visit: Payer: Medicare Other | Admitting: Anesthesiology

## 2020-12-04 ENCOUNTER — Other Ambulatory Visit: Payer: Self-pay

## 2020-12-04 ENCOUNTER — Ambulatory Visit
Admission: RE | Admit: 2020-12-04 | Discharge: 2020-12-04 | Disposition: A | Discharge status: Home / Self Care | Payer: Medicare Other | Source: Ambulatory Visit | Attending: Ophthalmology | Admitting: Ophthalmology

## 2020-12-04 ENCOUNTER — Encounter
Admission: RE | Disposition: A | Discharge status: Home / Self Care | Payer: Self-pay | Source: Ambulatory Visit | Attending: Ophthalmology

## 2020-12-04 DIAGNOSIS — Z9841 Cataract extraction status, right eye: Secondary | ICD-10-CM | POA: Insufficient documentation

## 2020-12-04 DIAGNOSIS — H2512 Age-related nuclear cataract, left eye: Secondary | ICD-10-CM | POA: Diagnosis present

## 2020-12-04 DIAGNOSIS — Z961 Presence of intraocular lens: Secondary | ICD-10-CM | POA: Insufficient documentation

## 2020-12-04 DIAGNOSIS — Z79899 Other long term (current) drug therapy: Secondary | ICD-10-CM | POA: Diagnosis not present

## 2020-12-04 DIAGNOSIS — Z87891 Personal history of nicotine dependence: Secondary | ICD-10-CM | POA: Insufficient documentation

## 2020-12-04 HISTORY — PX: CATARACT EXTRACTION W/PHACO: SHX586

## 2020-12-04 SURGERY — PHACOEMULSIFICATION, CATARACT, WITH IOL INSERTION
Anesthesia: Monitor Anesthesia Care | Site: Eye | Laterality: Left

## 2020-12-04 MED ORDER — SIGHTPATH DOSE#1 SODIUM HYALURONATE 10 MG/ML IO SOLUTION
PREFILLED_SYRINGE | INTRAOCULAR | Status: DC | PRN
  Administered 2020-12-04: 0.55 mL via INTRAOCULAR

## 2020-12-04 MED ORDER — LACTATED RINGERS IV SOLN: INTRAVENOUS | Status: DC

## 2020-12-04 MED ORDER — FENTANYL CITRATE (PF) 100 MCG/2ML IJ SOLN
INTRAMUSCULAR | Status: DC | PRN
  Administered 2020-12-04: 50 ug via INTRAVENOUS

## 2020-12-04 MED ORDER — SIGHTPATH DOSE#1 BSS IO SOLN
INTRAOCULAR | Status: DC | PRN
  Administered 2020-12-04: 15 mL

## 2020-12-04 MED ORDER — CYCLOPENTOLATE HCL 2 % OP SOLN
1.0000 [drp] | OPHTHALMIC | Status: AC
  Administered 2020-12-04 (×3): 1 [drp] via OPHTHALMIC

## 2020-12-04 MED ORDER — ONDANSETRON HCL 4 MG/2ML IJ SOLN: 4.0000 mg | Freq: Once | INTRAMUSCULAR | Status: DC | PRN

## 2020-12-04 MED ORDER — LIDOCAINE HCL (PF) 2 % IJ SOLN
INTRAOCULAR | Status: DC | PRN
  Administered 2020-12-04: 1 mL via INTRAOCULAR

## 2020-12-04 MED ORDER — MIDAZOLAM HCL 2 MG/2ML IJ SOLN
INTRAMUSCULAR | Status: DC | PRN
  Administered 2020-12-04: 1.5 mg via INTRAVENOUS

## 2020-12-04 MED ORDER — MOXIFLOXACIN HCL 0.5 % OP SOLN
OPHTHALMIC | Status: DC | PRN
  Administered 2020-12-04: 0.2 mL via OPHTHALMIC

## 2020-12-04 MED ORDER — TETRACAINE HCL 0.5 % OP SOLN
1.0000 [drp] | OPHTHALMIC | Status: DC | PRN
  Administered 2020-12-04 (×3): 1 [drp] via OPHTHALMIC

## 2020-12-04 MED ORDER — SIGHTPATH DOSE#1 SODIUM HYALURONATE 23 MG/ML IO SOLUTION
PREFILLED_SYRINGE | INTRAOCULAR | Status: DC | PRN
  Administered 2020-12-04: .6 mL via INTRAOCULAR

## 2020-12-04 MED ORDER — PHENYLEPHRINE HCL 10 % OP SOLN
1.0000 [drp] | OPHTHALMIC | Status: DC | PRN
  Administered 2020-12-04 (×2): 1 [drp] via OPHTHALMIC

## 2020-12-04 MED ORDER — PHENYLEPHRINE HCL 10 % OP SOLN
1.0000 [drp] | OPHTHALMIC | Status: AC | PRN
  Administered 2020-12-04: 1 [drp] via OPHTHALMIC

## 2020-12-04 MED ORDER — ACETAMINOPHEN 10 MG/ML IV SOLN: 1000.0000 mg | Freq: Once | INTRAVENOUS | Status: DC | PRN

## 2020-12-04 MED ORDER — SIGHTPATH DOSE#1 BSS IO SOLN
INTRAOCULAR | Status: DC | PRN
  Administered 2020-12-04: 88 mL via OPHTHALMIC

## 2020-12-04 SURGICAL SUPPLY — 16 items
CANNULA ANT/CHMB 27GA (MISCELLANEOUS) ×4 IMPLANT
DISSECTOR HYDRO NUCLEUS 50X22 (MISCELLANEOUS) ×2 IMPLANT
GLOVE SURG GAMMEX PI TX LF 7.5 (GLOVE) ×2 IMPLANT
GLOVE SURG SYN 8.5  E (GLOVE) ×1
GLOVE SURG SYN 8.5 E (GLOVE) ×1 IMPLANT
GOWN STRL REUS W/ TWL LRG LVL3 (GOWN DISPOSABLE) ×2 IMPLANT
GOWN STRL REUS W/TWL LRG LVL3 (GOWN DISPOSABLE) ×4
LENS IOL EYHANCE TORIC II 21.5 ×2 IMPLANT
LENS IOL EYHANCE TRC 600 21.5 ×1 IMPLANT
LENS IOL EYHNC TORIC 600 21.5 ×1 IMPLANT
MARKER SKIN DUAL TIP RULER LAB (MISCELLANEOUS) ×2 IMPLANT
PACK EYE AFTER SURG (MISCELLANEOUS) ×2 IMPLANT
SYR 3ML LL SCALE MARK (SYRINGE) ×2 IMPLANT
SYR TB 1ML LUER SLIP (SYRINGE) ×2 IMPLANT
WATER STERILE IRR 250ML POUR (IV SOLUTION) ×2 IMPLANT
WIPE NON LINTING 3.25X3.25 (MISCELLANEOUS) ×2 IMPLANT

## 2020-12-04 NOTE — H&P (Signed)
[facility]   Primary Care Physician:  Lynnea Ferrier, MD Ophthalmologist: Dr. Willey Blade  Pre-Procedure History & Physical: HPI:  Gerald Gross is a 75 y.o. male here for cataract surgery.   Past Medical History:  Diagnosis Date   Hypertension     Past Surgical History:  Procedure Laterality Date   CATARACT EXTRACTION W/PHACO Right 11/21/2020   Procedure: CATARACT EXTRACTION PHACO AND INTRAOCULAR LENS PLACEMENT (IOC) RIGHT EYHANCE TORIC 3.50 00:25.8;  Surgeon: Nevada Crane, MD;  Location: [facility];  Service: Ophthalmology;  Laterality: Right;   FEMUR FRACTURE SURGERY Right    LEFT HEART CATH AND CORONARY ANGIOGRAPHY Left 06/10/2017   Procedure: LEFT HEART CATH AND CORONARY ANGIOGRAPHY;  Surgeon: Dalia Heading, MD;  Location: [facility];  Service: Cardiovascular;  Laterality: Left;    Prior to Admission medications   Medication Sig Start Date End Date Taking? Authorizing Provider  carvedilol (COREG) 3.125 MG tablet Take 3.125 mg by mouth 2 (two) times daily with a meal.   Yes [provider]  lisinopril (ZESTRIL) 20 MG tablet Take 20 mg by mouth daily.   Yes [provider]  Multiple Vitamin (MULTIVITAMIN WITH MINERALS) TABS tablet Take 1 tablet by mouth daily. Centrum Silver   Yes [provider]    Allergies as of 10/11/2020   (No Known Allergies)    History reviewed. No pertinent family history.  Social History   Socioeconomic History   Marital status: Widowed    Spouse name: Not on file   Number of children: Not on file   Years of education: Not on file   Highest education level: Not on file  Occupational History   Not on file  Tobacco Use   Smoking status: Former    Types: Cigarettes    Quit date: 04/08/1990    Years since quitting: 30.6   Smokeless tobacco: Never  Vaping Use   Vaping Use: Never used  Substance and Sexual Activity   Alcohol use: No   Drug use: No   Sexual activity: Not  Currently  Other Topics Concern   Not on file  Social History Narrative   Not on file   Social Determinants of Health   Financial Resource Strain: Not on file  Food Insecurity: Not on file  Transportation Needs: Not on file  Physical Activity: Not on file  Stress: Not on file  Social Connections: Not on file  Intimate Partner Violence: Not on file    Review of Systems: See HPI, otherwise negative ROS  Physical Exam: BP (!) 172/105   Pulse 73   Temp (!) 97.3 F (36.3 C)   Ht 5\' 10"  (1.778 m)   Wt 98.9 kg   SpO2 96%   BMI 31.28 kg/m  General:   Alert, cooperative in NAD Head:  Normocephalic and atraumatic. Respiratory:  Normal work of breathing. Cardiovascular:  RRR  Impression/Plan: Gerald Gross is here for cataract surgery.  Risks, benefits, limitations, and alternatives regarding cataract surgery have been reviewed with the patient.  Questions have been answered.  All parties agreeable.   Clydene Laming, MD  12/04/2020, 11:07 AM

## 2020-12-04 NOTE — Anesthesia Postprocedure Evaluation (Signed)
Anesthesia Post Note  Patient: Gerald Gross  Procedure(s) Performed: CATARACT EXTRACTION PHACO AND INTRAOCULAR LENS PLACEMENT (IOC) LEFT EYHANCE TORIC 4.65 00:29.4 (Left: Eye)     Patient location during evaluation: PACU Anesthesia Type: MAC Level of consciousness: awake and alert Pain management: pain level controlled Vital Signs Assessment: post-procedure vital signs reviewed and stable Respiratory status: spontaneous breathing, nonlabored ventilation, respiratory function stable and patient connected to nasal cannula oxygen Cardiovascular status: stable and blood pressure returned to baseline Postop Assessment: no apparent nausea or vomiting Anesthetic complications: no   No notable events documented.  Gerald Gross  Gerald Gross

## 2020-12-04 NOTE — Anesthesia Preprocedure Evaluation (Signed)
Anesthesia Evaluation  Patient identified by MRN, date of birth, ID band Patient awake    Reviewed: NPO status   History of Anesthesia Complications Negative for: history of anesthetic complications  Airway Mallampati: III  TM Distance: >3 FB Neck ROM: full    Dental no notable dental hx.    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   Echocardiogram on 09/15/17 revealed moderate LV systolic dysfunction with an EF estimated at 30% with moderate AR and mild MR.     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; LAFB;  Reproductive/Obstetrics                             Anesthesia Physical  Anesthesia Plan  ASA: 3  Anesthesia Plan: MAC   Post-op Pain Management:    Induction: Intravenous  PONV Risk Score and Plan: 1 and TIVA  Airway Management Planned: Nasal Cannula  Additional Equipment:   Intra-op Plan:   Post-operative Plan:   Informed Consent: I have reviewed the patients History and Physical, chart, labs and discussed the procedure including the risks, benefits and alternatives for the proposed anesthesia with the patient or authorized representative who has indicated his/her understanding and acceptance.       Plan Discussed with: CRNA  Anesthesia Plan Comments:         Anesthesia Quick Evaluation

## 2020-12-04 NOTE — Transfer of Care (Signed)
Immediate Anesthesia Transfer of Care Note  Patient: Gerald Gross  Procedure(s) Performed: CATARACT EXTRACTION PHACO AND INTRAOCULAR LENS PLACEMENT (IOC) LEFT EYHANCE TORIC 4.65 00:29.4 (Left: Eye)  Patient Location: PACU  Anesthesia Type: MAC  Level of Consciousness: awake, alert  and patient cooperative  Airway and Oxygen Therapy: Patient Spontanous Breathing and Patient connected to supplemental oxygen  Post-op Assessment: Post-op Vital signs reviewed, Patient's Cardiovascular Status Stable, Respiratory Function Stable, Patent Airway and No signs of Nausea or vomiting  Post-op Vital Signs: Reviewed and stable  Complications: No notable events documented.

## 2020-12-04 NOTE — Op Note (Signed)
OPERATIVE NOTE  Gerald Gross [id number] 12/04/2020   PREOPERATIVE DIAGNOSIS:  Nuclear sclerotic cataract left eye.  H25.12   POSTOPERATIVE DIAGNOSIS:    Nuclear sclerotic cataract left eye.     PROCEDURE:  Phacoemusification with posterior chamber intraocular lens placement of the left eye   LENS:   Implant Name Type Inv. Item Serial No. Manufacturer Lot No. LRB No. Used Action  tecnis eyhance toricII IOL Intraocular Lens  [id number] [employer]  Left 1 Implanted      Procedure(s): CATARACT EXTRACTION PHACO AND INTRAOCULAR LENS PLACEMENT (IOC) LEFT EYHANCE TORIC 4.65 00:29.4 (Left)  [id number] +21.5   ULTRASOUND TIME: 0 minutes 29 seconds.  CDE 4.65   SURGEON:  Willey Blade, MD, MPH   ANESTHESIA:  Topical with tetracaine drops augmented with 1% preservative-free intracameral lidocaine.  ESTIMATED BLOOD LOSS: <1 mL   COMPLICATIONS:  None.   DESCRIPTION OF PROCEDURE:  The patient was identified in the holding room and transported to the operating room and placed in the supine position under the operating microscope.  The left eye was identified as the operative eye and it was prepped and draped in the usual sterile ophthalmic fashion.  The verion system was registered without difficulty.   A 1.0 millimeter clear-corneal paracentesis was made at the 5:00 position. 0.5 ml of preservative-free 1% lidocaine with epinephrine was injected into the anterior chamber.  The anterior chamber was filled with Healon 5 viscoelastic.  A 2.4 millimeter keratome was used to make a near-clear corneal incision at the 2:00 position.  A curvilinear capsulorrhexis was made with a cystotome and capsulorrhexis forceps.  Balanced salt solution was used to hydrodissect and hydrodelineate the nucleus.   Phacoemulsification was then used in stop and chop fashion to remove the lens nucleus and epinucleus.  The remaining cortex was then removed using the irrigation and aspiration handpiece. Healon was  then placed into the capsular bag to distend it for lens placement.  A lens was then injected into the capsular bag.  The remaining viscoelastic was aspirated.  The lens was rotated with guidance from the verion system.   Wounds were hydrated with balanced salt solution.  The anterior chamber was inflated to a physiologic pressure with balanced salt solution.  Intracameral vigamox 0.1 mL undiltued was injected into the eye and a drop placed onto the ocular surface.  No wound leaks were noted.  The patient was taken to the recovery room in stable condition without complications of anesthesia or surgery  Willey Blade 12/04/2020, 11:37 AM

## 2020-12-06 ENCOUNTER — Encounter: Payer: Self-pay | Admitting: Ophthalmology
